# Patient Record
Sex: Male | Born: 1970 | Race: White | Hispanic: No | Marital: Married | Smoking: Never smoker
Health system: Southern US, Community
[De-identification: ages and names within clinical notes are randomized; demographics above are authoritative.]

## PROBLEM LIST (undated history)

## (undated) HISTORY — PX: CHOLECYSTECTOMY: SHX55

## (undated) HISTORY — PX: HERNIA REPAIR: SHX51

---

## 2018-07-31 DIAGNOSIS — G8929 Other chronic pain: Secondary | ICD-10-CM | POA: Insufficient documentation

## 2018-07-31 DIAGNOSIS — R0683 Snoring: Secondary | ICD-10-CM | POA: Insufficient documentation

## 2018-08-28 DIAGNOSIS — B0229 Other postherpetic nervous system involvement: Secondary | ICD-10-CM | POA: Insufficient documentation

## 2018-08-28 DIAGNOSIS — G2581 Restless legs syndrome: Secondary | ICD-10-CM | POA: Diagnosis present

## 2018-08-28 HISTORY — DX: Restless legs syndrome: G25.81

## 2019-11-01 DIAGNOSIS — M3500 Sicca syndrome, unspecified: Secondary | ICD-10-CM

## 2019-11-01 DIAGNOSIS — M1A09X0 Idiopathic chronic gout, multiple sites, without tophus (tophi): Secondary | ICD-10-CM

## 2019-11-01 HISTORY — DX: Sjogren syndrome, unspecified: M35.00

## 2019-11-01 HISTORY — DX: Idiopathic chronic gout, multiple sites, without tophus (tophi): M1A.09X0

## 2019-12-19 DIAGNOSIS — I82409 Acute embolism and thrombosis of unspecified deep veins of unspecified lower extremity: Secondary | ICD-10-CM

## 2019-12-19 HISTORY — DX: Acute embolism and thrombosis of unspecified deep veins of unspecified lower extremity: I82.409

## 2020-01-17 ENCOUNTER — Emergency Department (HOSPITAL_BASED_OUTPATIENT_CLINIC_OR_DEPARTMENT_OTHER): Payer: BC Managed Care – PPO

## 2020-01-17 ENCOUNTER — Other Ambulatory Visit (HOSPITAL_BASED_OUTPATIENT_CLINIC_OR_DEPARTMENT_OTHER): Payer: Self-pay | Admitting: Physician Assistant

## 2020-01-17 ENCOUNTER — Encounter (HOSPITAL_BASED_OUTPATIENT_CLINIC_OR_DEPARTMENT_OTHER): Payer: Self-pay | Admitting: Emergency Medicine

## 2020-01-17 ENCOUNTER — Other Ambulatory Visit: Payer: Self-pay

## 2020-01-17 ENCOUNTER — Emergency Department (HOSPITAL_BASED_OUTPATIENT_CLINIC_OR_DEPARTMENT_OTHER)
Admission: EM | Admit: 2020-01-17 | Discharge: 2020-01-17 | Disposition: A | Discharge status: Home / Self Care | Payer: BC Managed Care – PPO | Attending: Emergency Medicine | Admitting: Emergency Medicine

## 2020-01-17 DIAGNOSIS — I2694 Multiple subsegmental pulmonary emboli without acute cor pulmonale: Secondary | ICD-10-CM | POA: Insufficient documentation

## 2020-01-17 DIAGNOSIS — J1282 Pneumonia due to coronavirus disease 2019: Secondary | ICD-10-CM | POA: Diagnosis not present

## 2020-01-17 DIAGNOSIS — U071 COVID-19: Secondary | ICD-10-CM | POA: Insufficient documentation

## 2020-01-17 DIAGNOSIS — I82441 Acute embolism and thrombosis of right tibial vein: Secondary | ICD-10-CM | POA: Insufficient documentation

## 2020-01-17 DIAGNOSIS — Z7901 Long term (current) use of anticoagulants: Secondary | ICD-10-CM | POA: Insufficient documentation

## 2020-01-17 DIAGNOSIS — I82451 Acute embolism and thrombosis of right peroneal vein: Secondary | ICD-10-CM | POA: Insufficient documentation

## 2020-01-17 DIAGNOSIS — R0602 Shortness of breath: Secondary | ICD-10-CM | POA: Diagnosis present

## 2020-01-17 LAB — BASIC METABOLIC PANEL
Anion gap: 8 (ref 5–15)
BUN: 12 mg/dL (ref 6–20)
CO2: 27 mmol/L (ref 22–32)
Calcium: 8.2 mg/dL — ABNORMAL LOW (ref 8.9–10.3)
Chloride: 99 mmol/L (ref 98–111)
Creatinine, Ser: 0.8 mg/dL (ref 0.61–1.24)
GFR, Estimated: >60 mL/min (ref >60)
Glucose, Bld: 89 mg/dL (ref 70–99)
Potassium: 3.9 mmol/L (ref 3.5–5.1)
Sodium: 134 mmol/L — ABNORMAL LOW (ref 135–145)

## 2020-01-17 LAB — CBC WITH DIFFERENTIAL/PLATELET
Abs Immature Granulocytes: 0.04 10*3/uL (ref 0.00–0.07)
Basophils Absolute: 0 10*3/uL (ref 0.0–0.1)
Basophils Relative: 0 %
Eosinophils Absolute: 0.2 10*3/uL (ref 0.0–0.5)
Eosinophils Relative: 2 %
HCT: 38.9 % — ABNORMAL LOW (ref 39.0–52.0)
Hemoglobin: 13.3 g/dL (ref 13.0–17.0)
Immature Granulocytes: 0 %
Lymphocytes Relative: 14 %
Lymphs Abs: 1.4 10*3/uL (ref 0.7–4.0)
MCH: 31.7 pg (ref 26.0–34.0)
MCHC: 34.2 g/dL (ref 30.0–36.0)
MCV: 92.6 fL (ref 80.0–100.0)
Monocytes Absolute: 0.9 10*3/uL (ref 0.1–1.0)
Monocytes Relative: 9 %
Neutro Abs: 7.3 10*3/uL (ref 1.7–7.7)
Neutrophils Relative %: 75 %
Platelets: 295 10*3/uL (ref 150–400)
RBC: 4.2 MIL/uL — ABNORMAL LOW (ref 4.22–5.81)
RDW: 12.2 % (ref 11.5–15.5)
WBC: 9.7 10*3/uL (ref 4.0–10.5)
nRBC: 0 % (ref 0.0–0.2)

## 2020-01-17 LAB — TROPONIN I (HIGH SENSITIVITY)
Troponin I (High Sensitivity): 5 ng/L (ref <18)
Troponin I (High Sensitivity): 4 ng/L (ref <18)

## 2020-01-17 MED ORDER — RIVAROXABAN 15 MG PO TABS
15.0000 mg | ORAL_TABLET | Freq: Once | ORAL | Status: AC
  Administered 2020-01-17: 19:00:00 15 mg via ORAL
  Filled 2020-01-17: qty 1

## 2020-01-17 MED ORDER — ACETAMINOPHEN 325 MG PO TABS
650.0000 mg | ORAL_TABLET | Freq: Once | ORAL | Status: AC
  Administered 2020-01-17: 18:00:00 650 mg via ORAL
  Filled 2020-01-17: qty 2

## 2020-01-17 MED ORDER — RIVAROXABAN (XARELTO) VTE STARTER PACK (15 & 20 MG): ORAL_TABLET | ORAL | 0 refills | Status: DC

## 2020-01-17 MED ORDER — IOHEXOL 350 MG/ML SOLN
100.0000 mL | Freq: Once | INTRAVENOUS | Status: AC | PRN
  Administered 2020-01-17: 17:00:00 100 mL via INTRAVENOUS

## 2020-01-17 NOTE — ED Notes (Signed)
Pt in CT.

## 2020-01-17 NOTE — ED Provider Notes (Addendum)
[facility] EMERGENCY DEPARTMENT Provider Note   CSN: [id number] Arrival date & time: 01/17/20  1142    History Chief Complaint  Patient presents with  . Leg Pain    right    Ian Gomez is a 49 y.o. male with past medical history significant for recent Covid diagnosis on 01/02/2020, recently diagnosed with pneumonia at urgent care on 01/10/2020.  Who presents for evaluation of right leg pain.  States 3 days ago he developed pain to his right calf.  Has noted some mild swelling.  He was told he needed to look out for this as Covid can cause blood clots.  States that shortness of breath is at his baseline from when he was diagnosed with Covid over the last 10 days. No worsening SOB, CP.  Denies fever, chills, nausea, vomiting, chest pain, hemoptysis, abdominal pain, diarrhea, dysuria.  He denies any prior history of PE or DVT.  No recent surgery, immobilization.  Rates his leg pain a 8/10.  Denies any redness or warmth.  No breaks in skin.  Denies additional aggravating or alleviating factors.  History obtained from patient and past medical records.  No interpreter used  HPI     History reviewed. No pertinent past medical history.  There are no problems to display for this patient.   History reviewed. No pertinent surgical history.     No family history on file.     Home Medications Prior to Admission medications   Medication Sig Start Date End Date Taking? Authorizing Provider  allopurinol (ZYLOPRIM) 300 MG tablet Take 300 mg by mouth daily.   Yes [provider]  amoxicillin-clavulanate (AUGMENTIN) 875-125 MG tablet Take 1 tablet by mouth 2 (two) times daily.   Yes [provider]  DULoxetine (CYMBALTA) 20 MG capsule Take 20 mg by mouth daily.   Yes [provider]  esomeprazole (NEXIUM) 40 MG capsule Take 40 mg by mouth daily at 12 noon.   Yes [provider]  ibuprofen (ADVIL) 200 MG tablet Take 200 mg by mouth every 6 (six)  hours as needed.   Yes [provider]  RIVAROXABAN Carlena Hurl) VTE STARTER PACK (15 & 20 MG) Follow package directions: Take one 15mg  tablet by mouth twice a day. On day 22, switch to one 20mg  tablet once a day. Take with food. 01/17/20  Yes Erling Arrazola A, PA-C    Allergies    Patient has no known allergies.  Review of Systems   Review of Systems  Constitutional: Negative.   HENT: Negative.   Respiratory: Positive for shortness of breath (Since dx of covid, not worsening).   Cardiovascular: Negative.   Gastrointestinal: Negative.   Genitourinary: Negative.   Musculoskeletal:       Right left pain  Skin: Negative.   Neurological: Negative.   All other systems reviewed and are negative.   Physical Exam Updated Vital Signs BP 125/82   Pulse 74   Temp 98.9 F (37.2 C) (Oral)   Resp 13   Ht 5\' 10"  (1.778 m)   Wt 88.9 kg   SpO2 96%   BMI 28.12 kg/m   Physical Exam Vitals and nursing note reviewed.  Constitutional:      General: He is not in acute distress.    Appearance: He is not ill-appearing, toxic-appearing or diaphoretic.  HENT:     Head: Normocephalic and atraumatic.     Jaw: There is normal jaw occlusion.     Right Ear: Tympanic membrane, ear canal  and external ear normal. There is no impacted cerumen. No hemotympanum. Tympanic membrane is not injected, scarred, perforated, erythematous, retracted or bulging.     Left Ear: Tympanic membrane, ear canal and external ear normal. There is no impacted cerumen. No hemotympanum. Tympanic membrane is not injected, scarred, perforated, erythematous, retracted or bulging.     Ears:     Comments: No Mastoid tenderness.    Nose:     Comments: Clear rhinorrhea and congestion to bilateral nares.  No sinus tenderness.    Mouth/Throat:     Comments: Posterior oropharynx clear.  Mucous membranes moist.  Tonsils without erythema or exudate.  Uvula midline without deviation.  No evidence of PTA or RPA.  No drooling,  dysphasia or trismus.  Phonation normal. Neck:     Trachea: Trachea and phonation normal.     Comments: No Neck stiffness or neck rigidity. No cervical lymphadenopathy. Cardiovascular:     Comments: No murmurs rubs or gallops. Pulmonary:     Comments: Clear to auscultation bilaterally without wheeze, rhonchi or rales.  No accessory muscle usage.  Able speak in full sentences. Abdominal:     Comments: Soft, nontender without rebound or guarding.  No CVA tenderness.  Musculoskeletal:     Cervical back: Full passive range of motion without pain and normal range of motion.     Comments: Moves all 4 extremities without difficulty.  Edema to right calf.  Tenderness to popliteal fossa.  No erythema or warmth.  Skin:    Comments: Brisk capillary refill.  No rashes or lesions.  Neurological:     Mental Status: He is alert.     Comments: Ambulatory in department without difficulty.  Cranial nerves II through XII grossly intact.  No facial droop.  No aphasia.    ED Results / Procedures / Treatments   Labs (all labs ordered are listed, but only abnormal results are displayed) Labs Reviewed  CBC WITH DIFFERENTIAL/PLATELET - Abnormal; Notable for the following components:      Result Value   RBC 4.20 (*)    HCT 38.9 (*)    All other components within normal limits  BASIC METABOLIC PANEL - Abnormal; Notable for the following components:   Sodium 134 (*)    Calcium 8.2 (*)    All other components within normal limits  TROPONIN I (HIGH SENSITIVITY)  TROPONIN I (HIGH SENSITIVITY)    EKG EKG Interpretation  Date/Time:  Thursday January 17 2020 15:36:38 EST Ventricular Rate:  63 PR Interval:    QRS Duration: 96 QT Interval:  389 QTC Calculation: 399 R Axis:   51 Text Interpretation: Sinus rhythm Baseline wander in lead(s) V5 No prior ECG for comparison. No STEMI Confirmed by Theda Belfast ([id number]) on 01/17/2020 6:17:16 PM   Radiology CT Angio Chest PE W/Cm &/Or Wo Cm  Result Date:  01/17/2020 CLINICAL DATA:  PE suspected, high prob SOB, COVID, Positive DVT study COVID positive with right leg DVT. EXAM: CT ANGIOGRAPHY CHEST WITH CONTRAST TECHNIQUE: Multidetector CT imaging of the chest was performed using the standard protocol during bolus administration of intravenous contrast. Multiplanar CT image reconstructions and MIPs were obtained to evaluate the vascular anatomy. CONTRAST:  OMNIPAQUE IOHEXOL 350 MG/ML SOLN COMPARISON:  Chest radiograph 01/07/2020 FINDINGS: Cardiovascular: Positive for pulmonary embolus with filling defects in the segmental right lower lobe pulmonary arteries extending into subsegmental branches. Subsegmental filling defects in the right middle lobe and left lower lobe pulmonary arteries. Thromboembolic burden is mild. There is no right  heart strain, RV to LV ratio is less than 1. Heart is normal in size. No pericardial effusion. Normal caliber thoracic aorta without dissection or acute aortic findings. Mediastinum/Nodes: Mild bilateral hilar adenopathy, 14 mm on the right and 11 mm on the left. No enlarged mediastinal nodes. No thyroid nodule. Slightly patulous esophagus without wall thickening. No pneumomediastinum. Lungs/Pleura: Multifocal geographic, nodular consolidative and ground-glass opacities throughout both lungs in a peripheral and basilar predominant distribution. Findings typical of COVID-19, moderate parenchymal involvement. No pleural fluid. No pneumothorax. There is mild central bronchial thickening. Upper Abdomen: Cholecystectomy.  No acute upper abdominal findings. Musculoskeletal: Mild scoliosis with degenerative change in the thoracic spine. There are no acute or suspicious osseous abnormalities. Review of the MIP images confirms the above findings. IMPRESSION: 1. Positive for bilateral pulmonary embolus with mild clot burden. Filling defects within the segmental right lower lobe, subsegmental right lower, middle, and left lower lobe pulmonary  arteries. No right heart strain. 2. Multifocal geographic, nodular consolidative and ground-glass opacities throughout both lungs in a peripheral and basilar predominant distribution. Findings typical of COVID-19, moderate parenchymal involvement. 3. Mild bilateral hilar adenopathy is likely reactive. Critical Value/emergent results were called by telephone at the time of interpretation on 01/17/2020 at 5:44 pm to Dr Rush Landmark, who verbally acknowledged these results. Electronically Signed   By: Narda Rutherford M.D.   On: 01/17/2020 17:45   US Venous Img Lower Unilateral Right  Result Date: 01/17/2020 CLINICAL DATA:  Right calf pain and edema. EXAM: RIGHT LOWER EXTREMITY VENOUS DOPPLER ULTRASOUND TECHNIQUE: Gray-scale sonography with graded compression, as well as color Doppler and duplex ultrasound were performed to evaluate the lower extremity deep venous systems from the level of the common femoral vein and including the common femoral, femoral, profunda femoral, popliteal and calf veins including the posterior tibial, peroneal and gastrocnemius veins when visible. The superficial great saphenous vein was also interrogated. Spectral Doppler was utilized to evaluate flow at rest and with distal augmentation maneuvers in the common femoral, femoral and popliteal veins. COMPARISON:  None. FINDINGS: Contralateral Common Femoral Vein: Respiratory phasicity is normal and symmetric with the symptomatic side. No evidence of thrombus. Normal compressibility. Common Femoral Vein: No evidence of thrombus. Normal compressibility, respiratory phasicity and response to augmentation. Saphenofemoral Junction: No evidence of thrombus. Normal compressibility and flow on color Doppler imaging. Profunda Femoral Vein: No evidence of thrombus. Normal compressibility and flow on color Doppler imaging. Femoral Vein: No evidence of thrombus. Normal compressibility, respiratory phasicity and response to augmentation. Popliteal Vein:  No evidence of thrombus. Normal compressibility, respiratory phasicity and response to augmentation. Calf Veins: Thrombus identified in visualized segments of the right posterior tibial and peroneal veins. Superficial Great Saphenous Vein: There is some thrombus identified in segments of the great saphenous vein at the level of the knee and extending just into the distal thigh. Venous Reflux:  None. Other Findings:  No abnormal fluid collections identified. IMPRESSION: 1. Positive for deep vein thrombosis in the right posterior tibial and peroneal veins below the knee. 2. Superficial thrombophlebitis in a segment of the great saphenous vein at the level of the knee and distal thigh. Electronically Signed   By: Irish Lack M.D.   On: 01/17/2020 15:10    Procedures .Critical Care Performed by: Linwood Dibbles, PA-C Authorized by: Linwood Dibbles, PA-C   Critical care provider statement:    Critical care time (minutes):  35   Critical care was necessary to treat or prevent imminent or life-threatening deterioration  of the following conditions:  Respiratory failure and circulatory failure   Critical care was time spent personally by me on the following activities:  Discussions with consultants, evaluation of patient's response to treatment, examination of patient, ordering and performing treatments and interventions, ordering and review of laboratory studies, ordering and review of radiographic studies, pulse oximetry, re-evaluation of patient's condition, obtaining history from patient or surrogate and review of old charts   (including critical care time)  Medications Ordered in ED Medications  iohexol (OMNIPAQUE) 350 MG/ML injection 100 mL (100 mLs Intravenous Contrast Given 01/17/20 1710)  acetaminophen (TYLENOL) tablet 650 mg (650 mg Oral Given 01/17/20 1811)  Rivaroxaban (XARELTO) tablet 15 mg (15 mg Oral Given 01/17/20 1925)   ED Course  I have reviewed the triage vital signs and  the nursing notes.  Pertinent labs & imaging results that were available during my care of the patient were reviewed by me and considered in my medical decision making (see chart for details).  49 year old, recent Covid diagnosis presents for evaluation of right calf pain.  No prior history of PE or DVT.  No history of clotting disorders.  Diagnosed 2 weeks ago with COVID.  States that shortness of breath is at his baseline from when he was diagnosed with Covid.  His shortness of breath is not worsening.  He has no hemoptysis or chest pain.  On arrival he is without tachycardia, tachypnea or hypoxia.  He appears overall well.  He does not appear clinically dehydrated.  Heart and lungs clear.  Abdomen soft, nontender.  He has a nonfocal neuro exam without deficits.  Does have some swelling to his right calf.  No overlying erythema, warmth, no breaks in skin.  Plan on labs, imaging and reassess  Labs and imaging personally reviewed and interpreted:  CBC without leukocytosis Metabolic panel 134, no additional electrolyte, renal or liver abnormality Trop 5>>4 EKG without STEMI, no right heart strain US with Tibial and peroneal DVT CTA chest with bilateral PE with MILD clot burden, No heart strain per Radiologist.  Shared decision making with patient for outpatient vs inpatient management given very reassuring VS and no heart strain on CT.  He remains without tachycardia, tachypnea or hypoxia.  He continues to deny any chest pain or shortness of breath.  Will ambulate patient to see if he has any respiratory distress.  PESI score 60 pts Very low risk per MD calc.  Patient ambulated in halls he denies any dyspnea on exertion. He was without any tachycardia, tachypnea or hypoxia with ambulation.  Again, Shared decision making with patient. He is comfortable starting oral anticoagulation here in ED with close outpatient followup. I have had very thorough discussion with patient new or worsening symptoms  where he is agreeable for this. Given first dose of Xarelto. No hx of GI bleed, spontaneous head bleed.  He will follow-up with PCP over the next week.  The patient has been appropriately medically screened and/or stabilized in the ED. I have low suspicion for any other emergent medical condition which would require further screening, evaluation or treatment in the ED or require inpatient management.  Patient is hemodynamically stable and in no acute distress.  Patient able to ambulate in department prior to ED.  Evaluation does not show acute pathology that would require ongoing or additional emergent interventions while in the emergency department or further inpatient treatment.  I have discussed the diagnosis with the patient and answered all questions.  Pain is been managed while  in the emergency department and patient has no further complaints prior to discharge.  Patient is comfortable with plan discussed in room and is stable for discharge at this time.  I have discussed strict return precautions for returning to the emergency department.  Patient was encouraged to follow-up with PCP/specialist refer to at discharge.  Patient seen and evaluated by attending, Dr. Rush Landmark who agrees with above treatment, plan and disposition.    MDM Rules/Calculators/A&P                          Ian Gomez was evaluated in Emergency Department on 01/17/2020 for the symptoms described in the history of present illness. He was evaluated in the context of the global COVID-19 pandemic, which necessitated consideration that the patient might be at risk for infection with the SARS-CoV-2 virus that causes COVID-19. Institutional protocols and algorithms that pertain to the evaluation of patients at risk for COVID-19 are in a state of rapid change based on information released by regulatory bodies including the CDC and federal and state organizations. These policies and algorithms were followed during the patient's care  in the ED. Final Clinical Impression(s) / ED Diagnoses Final diagnoses:  Multiple subsegmental pulmonary emboli without acute cor pulmonale (HCC)  Acute deep vein thrombosis (DVT) of right peroneal vein (HCC)  Pneumonia due to COVID-19 virus    Rx / DC Orders ED Discharge Orders         Ordered    RIVAROXABAN (XARELTO) VTE STARTER PACK (15 & 20 MG)        01/17/20 1915           Chaunce Winkels A, PA-C 01/17/20 1943    Linwood Dibbles, PA-C 01/17/20 1947    Tegeler, Canary Brim, MD 01/17/20 [phone]

## 2020-01-17 NOTE — Discharge Instructions (Signed)
We have given you your first dose of blood thinner here in the ED.  It is important to take as prescribed. Do not miss any doses.  If you have worsening Chest pain, shortness of breath, swelling of you legs, Headache, lightheadedness, passing out seek reevaluation here in the ED

## 2020-01-17 NOTE — ED Notes (Signed)
Discharge instructions discussed with patient. Verbalized understanding. Importance of following medication instructions and not missing any doses discussed. Departs ED at this time in stable conditions.

## 2020-01-17 NOTE — ED Triage Notes (Signed)
Reports right leg, covid + 01/02/2020. Pneumonia 01/10/2020. Worried about DVT. Denies chest pain.

## 2020-01-17 NOTE — ED Notes (Signed)
Pt on monitor and vitals cycling 

## 2020-01-18 MED FILL — XARELTO STARTER PACK: 15 & 20 | 30 days supply | Qty: 51 | Fill #0

## 2020-02-01 ENCOUNTER — Other Ambulatory Visit (HOSPITAL_BASED_OUTPATIENT_CLINIC_OR_DEPARTMENT_OTHER): Payer: Self-pay | Admitting: Internal Medicine

## 2020-02-13 MED FILL — XARELTO 20 MG TABLET: 20 | 30 days supply | Qty: 30 | Fill #0

## 2020-03-05 DIAGNOSIS — Z86718 Personal history of other venous thrombosis and embolism: Secondary | ICD-10-CM

## 2020-03-05 DIAGNOSIS — Z1589 Genetic susceptibility to other disease: Secondary | ICD-10-CM

## 2020-03-05 DIAGNOSIS — Z86711 Personal history of pulmonary embolism: Secondary | ICD-10-CM | POA: Diagnosis present

## 2020-03-05 HISTORY — DX: Genetic susceptibility to other disease: Z15.89

## 2020-03-05 HISTORY — DX: Personal history of pulmonary embolism: Z86.711

## 2020-03-10 MED FILL — XARELTO 20 MG TABLET: 20 | 30 days supply | Qty: 30 | Fill #1

## 2020-04-14 MED FILL — XARELTO 20 MG TABLET: 20 | 30 days supply | Qty: 30 | Fill #2

## 2020-05-12 ENCOUNTER — Other Ambulatory Visit (HOSPITAL_BASED_OUTPATIENT_CLINIC_OR_DEPARTMENT_OTHER): Payer: Self-pay

## 2020-05-12 MED FILL — Rivaroxaban Tab 20 MG: ORAL | 90 days supply | Qty: 90 | Fill #0 | Status: AC

## 2020-09-03 ENCOUNTER — Emergency Department (HOSPITAL_BASED_OUTPATIENT_CLINIC_OR_DEPARTMENT_OTHER): Payer: Managed Care, Other (non HMO)

## 2020-09-03 ENCOUNTER — Inpatient Hospital Stay (HOSPITAL_BASED_OUTPATIENT_CLINIC_OR_DEPARTMENT_OTHER)
Admission: EM | Admit: 2020-09-03 | Discharge: 2020-09-08 | DRG: 390 | Disposition: A | Discharge status: Home / Self Care | Payer: Managed Care, Other (non HMO) | Attending: Surgery | Admitting: Surgery

## 2020-09-03 ENCOUNTER — Encounter (HOSPITAL_BASED_OUTPATIENT_CLINIC_OR_DEPARTMENT_OTHER): Payer: Self-pay

## 2020-09-03 ENCOUNTER — Other Ambulatory Visit: Payer: Self-pay

## 2020-09-03 DIAGNOSIS — G43909 Migraine, unspecified, not intractable, without status migrainosus: Secondary | ICD-10-CM | POA: Diagnosis present

## 2020-09-03 DIAGNOSIS — Z7901 Long term (current) use of anticoagulants: Secondary | ICD-10-CM | POA: Diagnosis not present

## 2020-09-03 DIAGNOSIS — G2581 Restless legs syndrome: Secondary | ICD-10-CM | POA: Diagnosis present

## 2020-09-03 DIAGNOSIS — K56600 Partial intestinal obstruction, unspecified as to cause: Secondary | ICD-10-CM | POA: Diagnosis present

## 2020-09-03 DIAGNOSIS — K529 Noninfective gastroenteritis and colitis, unspecified: Secondary | ICD-10-CM | POA: Diagnosis present

## 2020-09-03 DIAGNOSIS — M1A09X0 Idiopathic chronic gout, multiple sites, without tophus (tophi): Secondary | ICD-10-CM | POA: Diagnosis present

## 2020-09-03 DIAGNOSIS — Z86718 Personal history of other venous thrombosis and embolism: Secondary | ICD-10-CM

## 2020-09-03 DIAGNOSIS — J342 Deviated nasal septum: Secondary | ICD-10-CM | POA: Diagnosis present

## 2020-09-03 DIAGNOSIS — Z8616 Personal history of COVID-19: Secondary | ICD-10-CM | POA: Diagnosis not present

## 2020-09-03 DIAGNOSIS — M1A9XX0 Chronic gout, unspecified, without tophus (tophi): Secondary | ICD-10-CM | POA: Diagnosis present

## 2020-09-03 DIAGNOSIS — Z86711 Personal history of pulmonary embolism: Secondary | ICD-10-CM | POA: Diagnosis not present

## 2020-09-03 DIAGNOSIS — K567 Ileus, unspecified: Secondary | ICD-10-CM | POA: Diagnosis present

## 2020-09-03 DIAGNOSIS — Z1589 Genetic susceptibility to other disease: Secondary | ICD-10-CM

## 2020-09-03 DIAGNOSIS — K56609 Unspecified intestinal obstruction, unspecified as to partial versus complete obstruction: Secondary | ICD-10-CM | POA: Diagnosis not present

## 2020-09-03 DIAGNOSIS — Z9049 Acquired absence of other specified parts of digestive tract: Secondary | ICD-10-CM | POA: Diagnosis not present

## 2020-09-03 DIAGNOSIS — Z20822 Contact with and (suspected) exposure to covid-19: Secondary | ICD-10-CM | POA: Diagnosis present

## 2020-09-03 DIAGNOSIS — Z79899 Other long term (current) drug therapy: Secondary | ICD-10-CM | POA: Diagnosis not present

## 2020-09-03 DIAGNOSIS — K219 Gastro-esophageal reflux disease without esophagitis: Secondary | ICD-10-CM | POA: Diagnosis present

## 2020-09-03 DIAGNOSIS — Z0189 Encounter for other specified special examinations: Secondary | ICD-10-CM

## 2020-09-03 HISTORY — DX: Personal history of COVID-19: Z86.16

## 2020-09-03 LAB — CBC WITH DIFFERENTIAL/PLATELET
Abs Immature Granulocytes: 0.03 10*3/uL (ref 0.00–0.07)
Basophils Absolute: 0 10*3/uL (ref 0.0–0.1)
Basophils Relative: 0 %
Eosinophils Absolute: 0.1 10*3/uL (ref 0.0–0.5)
Eosinophils Relative: 1 %
HCT: 45.8 % (ref 39.0–52.0)
Hemoglobin: 16.6 g/dL (ref 13.0–17.0)
Immature Granulocytes: 0 %
Lymphocytes Relative: 11 %
Lymphs Abs: 1 10*3/uL (ref 0.7–4.0)
MCH: 32.9 pg (ref 26.0–34.0)
MCHC: 36.2 g/dL — ABNORMAL HIGH (ref 30.0–36.0)
MCV: 90.9 fL (ref 80.0–100.0)
Monocytes Absolute: 1.2 10*3/uL — ABNORMAL HIGH (ref 0.1–1.0)
Monocytes Relative: 14 %
Neutro Abs: 6.5 10*3/uL (ref 1.7–7.7)
Neutrophils Relative %: 74 %
Platelets: 208 10*3/uL (ref 150–400)
RBC: 5.04 MIL/uL (ref 4.22–5.81)
RDW: 12.3 % (ref 11.5–15.5)
WBC: 8.7 10*3/uL (ref 4.0–10.5)
nRBC: 0 % (ref 0.0–0.2)

## 2020-09-03 LAB — COMPREHENSIVE METABOLIC PANEL
ALT: 32 U/L (ref 0–44)
AST: 34 U/L (ref 15–41)
Albumin: 4.1 g/dL (ref 3.5–5.0)
Alkaline Phosphatase: 69 U/L (ref 38–126)
Anion gap: 10 (ref 5–15)
BUN: 10 mg/dL (ref 6–20)
CO2: 25 mmol/L (ref 22–32)
Calcium: 8.9 mg/dL (ref 8.9–10.3)
Chloride: 100 mmol/L (ref 98–111)
Creatinine, Ser: 0.82 mg/dL (ref 0.61–1.24)
GFR, Estimated: >60 mL/min (ref >60)
Glucose, Bld: 105 mg/dL — ABNORMAL HIGH (ref 70–99)
Potassium: 3.8 mmol/L (ref 3.5–5.1)
Sodium: 135 mmol/L (ref 135–145)
Total Bilirubin: 0.6 mg/dL (ref 0.3–1.2)
Total Protein: 8 g/dL (ref 6.5–8.1)

## 2020-09-03 LAB — RESP PANEL BY RT-PCR (FLU A&B, COVID) ARPGX2
Influenza A by PCR: NEGATIVE
Influenza B by PCR: NEGATIVE
SARS Coronavirus 2 by RT PCR: NEGATIVE

## 2020-09-03 LAB — LIPASE, BLOOD: Lipase: 24 U/L (ref 11–51)

## 2020-09-03 MED ORDER — LACTATED RINGERS IV BOLUS: 1000.0000 mL | Freq: Three times a day (TID) | INTRAVENOUS | Status: AC | PRN

## 2020-09-03 MED ORDER — PHENOL 1.4 % MT LIQD: 2.0000 | OROMUCOSAL | Status: DC | PRN

## 2020-09-03 MED ORDER — LACTATED RINGERS IV BOLUS
1000.0000 mL | Freq: Once | INTRAVENOUS | Status: AC
  Administered 2020-09-03: 1000 mL via INTRAVENOUS

## 2020-09-03 MED ORDER — METOPROLOL TARTRATE 5 MG/5ML IV SOLN: 5.0000 mg | Freq: Four times a day (QID) | INTRAVENOUS | Status: DC | PRN

## 2020-09-03 MED ORDER — METHOCARBAMOL 1000 MG/10ML IJ SOLN
1000.0000 mg | Freq: Four times a day (QID) | INTRAVENOUS | Status: DC | PRN
  Administered 2020-09-04: 1000 mg via INTRAVENOUS
  Filled 2020-09-03: qty 1000
  Filled 2020-09-03: qty 10

## 2020-09-03 MED ORDER — PROCHLORPERAZINE EDISYLATE 10 MG/2ML IJ SOLN: 5.0000 mg | INTRAMUSCULAR | Status: DC | PRN

## 2020-09-03 MED ORDER — SIMETHICONE 40 MG/0.6ML PO SUSP
80.0000 mg | Freq: Four times a day (QID) | ORAL | Status: DC | PRN
  Filled 2020-09-03: qty 1.2

## 2020-09-03 MED ORDER — HYDROMORPHONE HCL 1 MG/ML IJ SOLN
1.0000 mg | Freq: Once | INTRAMUSCULAR | Status: AC
  Administered 2020-09-03: 1 mg via INTRAVENOUS
  Filled 2020-09-03: qty 1

## 2020-09-03 MED ORDER — METOCLOPRAMIDE HCL 5 MG/ML IJ SOLN
10.0000 mg | Freq: Once | INTRAMUSCULAR | Status: AC
  Administered 2020-09-03: 10 mg via INTRAVENOUS
  Filled 2020-09-03: qty 2

## 2020-09-03 MED ORDER — ACETAMINOPHEN 650 MG RE SUPP: 650.0000 mg | Freq: Four times a day (QID) | RECTAL | Status: DC | PRN

## 2020-09-03 MED ORDER — HYDROMORPHONE HCL 1 MG/ML IJ SOLN
0.5000 mg | INTRAMUSCULAR | Status: DC | PRN
  Administered 2020-09-03: 0.5 mg via INTRAVENOUS
  Administered 2020-09-04 (×2): 1 mg via INTRAVENOUS
  Filled 2020-09-03 (×3): qty 1

## 2020-09-03 MED ORDER — HYDROMORPHONE HCL 1 MG/ML IJ SOLN
1.0000 mg | Freq: Once | INTRAMUSCULAR | Status: AC | PRN
  Administered 2020-09-03: 1 mg via INTRAVENOUS
  Filled 2020-09-03: qty 1

## 2020-09-03 MED ORDER — DIATRIZOATE MEGLUMINE & SODIUM 66-10 % PO SOLN: 90.0000 mL | Freq: Once | ORAL | Status: DC

## 2020-09-03 MED ORDER — LIP MEDEX EX OINT
1.0000 "application " | TOPICAL_OINTMENT | Freq: Two times a day (BID) | CUTANEOUS | Status: DC
  Administered 2020-09-03 – 2020-09-07 (×8): 1 via TOPICAL
  Filled 2020-09-03 (×3): qty 7

## 2020-09-03 MED ORDER — SODIUM CHLORIDE 0.9 % IV SOLN
8.0000 mg | Freq: Four times a day (QID) | INTRAVENOUS | Status: DC | PRN
  Filled 2020-09-03: qty 4

## 2020-09-03 MED ORDER — ONDANSETRON HCL 4 MG/2ML IJ SOLN
4.0000 mg | Freq: Four times a day (QID) | INTRAMUSCULAR | Status: DC | PRN
  Administered 2020-09-04 – 2020-09-05 (×4): 4 mg via INTRAVENOUS
  Filled 2020-09-03 (×4): qty 2

## 2020-09-03 MED ORDER — LACTATED RINGERS IV SOLN: INTRAVENOUS | Status: DC

## 2020-09-03 MED ORDER — MAGIC MOUTHWASH
15.0000 mL | Freq: Four times a day (QID) | ORAL | Status: DC | PRN
  Filled 2020-09-03: qty 15

## 2020-09-03 MED ORDER — IOHEXOL 300 MG/ML  SOLN
100.0000 mL | Freq: Once | INTRAMUSCULAR | Status: AC | PRN
  Administered 2020-09-03: 79 mL via INTRAVENOUS

## 2020-09-03 MED ORDER — ALUM & MAG HYDROXIDE-SIMETH 200-200-20 MG/5ML PO SUSP
30.0000 mL | Freq: Four times a day (QID) | ORAL | Status: DC | PRN
  Administered 2020-09-08: 30 mL via ORAL
  Filled 2020-09-03: qty 30

## 2020-09-03 MED ORDER — DIPHENHYDRAMINE HCL 50 MG/ML IJ SOLN: 12.5000 mg | Freq: Four times a day (QID) | INTRAMUSCULAR | Status: DC | PRN

## 2020-09-03 MED ORDER — MENTHOL 3 MG MT LOZG: 1.0000 | LOZENGE | OROMUCOSAL | Status: DC | PRN

## 2020-09-03 NOTE — ED Notes (Signed)
Pt has a severe deviated septum. Unsuccessful attempt to insert NG tube x 3.

## 2020-09-03 NOTE — ED Notes (Signed)
ED Provider at bedside. 

## 2020-09-03 NOTE — ED Notes (Signed)
RN made aware that there were 2 attempts to place NG tube earlier, one in each nostril. Placement was complicated by the patient's deviated septum. RN discussed with EDP. Secretary paged surgery team. RN will wait to hear back from surgery team what the plan is for placement following the previous failed attempts.

## 2020-09-03 NOTE — ED Triage Notes (Signed)
Pt arrives ambulatory to triage with c/o abdominal pain starting yesterday with 13 episodes of watery diarrhea today pt reports that he has been vomiting brown and unable to eat with a "tight abdomen".

## 2020-09-03 NOTE — ED Provider Notes (Signed)
[facility] Provider Note   CSN: [id number] Arrival date & time: 09/03/20  1304     History Chief Complaint  Patient presents with   Abdominal Pain    Ian Gomez is a 50 y.o. male.  HPI 50 year old male with severe upper abdominal pain.  Feels like something is blocked.  He started having watery diarrhea yesterday afternoon after developing abdominal pain 4 hours prior.  He tried to drink some water today and while it did not come back up he feels like it was blocked and he was getting a lot more severe pain after drinking it.  He has not had a fever though has had some chills and a low-grade temp of 99.6.  Some back discomfort.  He vomited once previous to this and it was brown.  Ibuprofen and Tylenol helped the fever sensation and chills with body aches but the abdominal pain is still severe.  Past Medical History:  Diagnosis Date   Chronic gout of multiple sites 11/01/2019   DVT (deep venous thrombosis) ([place]) 12/2019   History of COVID-19 [id number] 09/03/2020   History of pulmonary embolism 03/05/2020   HLA B27 (HLA B27 positive) 03/05/2020   RLS (restless legs syndrome) 08/28/2018   Sicca ([place]) 11/01/2019    Patient Active Problem List   Diagnosis Date Noted   History of COVID-19 Dec2021 09/03/2020   SBO (small bowel obstruction) ([place]) 09/03/2020   History of pulmonary embolism 03/05/2020   History of DVT (deep vein thrombosis) 03/05/2020   HLA B27 (HLA B27 positive) 03/05/2020   Sicca ([place]) 11/01/2019   Chronic gout of multiple sites 11/01/2019   RLS (restless legs syndrome) 08/28/2018   Herpes zoster with nervous system complication 75/64/3329   Snoring 07/31/2018   Chronic nonintractable headache 07/31/2018    Past Surgical History:  Procedure Laterality Date   CHOLECYSTECTOMY     HERNIA REPAIR         No family history on file.  Social History   Tobacco Use   Smoking status: Never   Smokeless tobacco: Never  Substance Use  Topics   Alcohol use: Yes    Comment: social   Drug use: Never    Home Medications Prior to Admission medications   Medication Sig Start Date End Date Taking? Authorizing Provider  allopurinol (ZYLOPRIM) 300 MG tablet Take 300 mg by mouth daily.    [provider]  amoxicillin-clavulanate (AUGMENTIN) 875-125 MG tablet Take 1 tablet by mouth 2 (two) times daily.    [provider]  DULoxetine (CYMBALTA) 20 MG capsule Take 20 mg by mouth daily.    [provider]  esomeprazole (NEXIUM) 40 MG capsule Take 40 mg by mouth daily at 12 noon.    [provider]  ibuprofen (ADVIL) 200 MG tablet Take 200 mg by mouth every 6 (six) hours as needed.    [provider]  rivaroxaban (XARELTO) 20 MG TABS tablet TAKE ONE TABLET BY MOUTH DAILY WITH EVENING MEAL 02/01/20 01/31/21  Charleston Poot, MD  RIVAROXABAN [facility] [employer]) VTE STARTER PACK (15 & 20 MG) TAKE ONE 15MG TABLET BY MOUTH 2 TIMES DAILY, ON DAY 22 SWITCH TO ONE 20MG TABLET ONCE DAILY. **TAKE WITH FOOD** 01/17/20 01/16/21  Henderly, Britni A, PA-C    Allergies    Patient has no known allergies.  Review of Systems   Review of Systems  Constitutional:  Positive for chills and fever.  Gastrointestinal:  Positive for abdominal pain, diarrhea and vomiting.  Musculoskeletal:  Positive for myalgias.  All other systems reviewed and are negative.  Physical Exam Updated Vital Signs BP (!) 121/57   Pulse 80   Temp 99.6 F (37.6 C) (Oral)   Resp 17   Ht _0  (1.778 m)   Wt 96.2 kg   SpO2 91%   BMI 30.42 kg/m   Physical Exam Vitals and nursing note reviewed.  Constitutional:      Appearance: He is well-developed. He is not ill-appearing.  HENT:     Head: Normocephalic and atraumatic.     Right Ear: External ear normal.     Left Ear: External ear normal.     Nose: Nose normal.  Eyes:     General:        Right eye: No discharge.        Left eye: No discharge.  Cardiovascular:     Rate and  Rhythm: Regular rhythm. Tachycardia present.     Heart sounds: Normal heart sounds.  Pulmonary:     Effort: Pulmonary effort is normal.     Breath sounds: Normal breath sounds.  Abdominal:     Palpations: Abdomen is soft.     Tenderness: There is abdominal tenderness in the right upper quadrant and epigastric area.     Hernia: A hernia is present. Hernia is present in the ventral area (not incarcerated).  Musculoskeletal:     Cervical back: Neck supple.  Skin:    General: Skin is warm and dry.  Neurological:     Mental Status: He is alert.  Psychiatric:        Mood and Affect: Mood is not anxious.    ED Results / Procedures / Treatments   Labs (all labs ordered are listed, but only abnormal results are displayed) Labs Reviewed  COMPREHENSIVE METABOLIC PANEL - Abnormal; Notable for the following components:      Result Value   Glucose, Bld 105 (*)    All other components within normal limits  CBC WITH DIFFERENTIAL/PLATELET - Abnormal; Notable for the following components:   MCHC 36.2 (*)    Monocytes Absolute 1.2 (*)    All other components within normal limits  RESP PANEL BY RT-PCR (FLU A&B, COVID) [id number]  LIPASE, BLOOD    EKG EKG Interpretation  Date/Time:  Wednesday September 03 2020 13:16:45 EDT Ventricular Rate:  127 PR Interval:  138 QRS Duration: 90 QT Interval:  310 QTC Calculation: 450 R Axis:   65 Text Interpretation: Sinus tachycardia T wave abnormality, consider inferior ischemia Abnormal ECG Confirmed by Sherwood Gambler [phone]) on 09/03/2020 1:48:54 PM  Radiology CT ABDOMEN PELVIS W CONTRAST  Result Date: 09/03/2020 CLINICAL DATA:  Nausea and vomiting. Left upper quadrant abdominal pain. EXAM: CT ABDOMEN AND PELVIS WITH CONTRAST TECHNIQUE: Multidetector CT imaging of the abdomen and pelvis was performed using the standard protocol following bolus administration of intravenous contrast. CONTRAST:  54m OMNIPAQUE IOHEXOL 300 MG/ML  SOLN COMPARISON:  CT abdomen  pelvis 01/31/2012 FINDINGS: Lower chest: Bilateral lower lobe subsegmental atelectasis. Hepatobiliary: No focal liver abnormality. Status post cholecystectomy. No biliary dilatation. Pancreas: Diffusely atrophic. No focal lesion. Otherwise normal pancreatic contour. No surrounding inflammatory changes. No main pancreatic ductal dilatation. Spleen: Normal in size without focal abnormality. Adrenals/Urinary Tract: No adrenal nodule bilaterally. Bilateral kidneys enhance symmetrically. No hydronephrosis. No hydroureter. The urinary bladder is unremarkable. Stomach/Bowel: Stomach is within normal limits. Several loops of mid abdomen small bowel demonstrate dilatation with gas and air-fluid levels. Small bowel caliber measures up to 3.8 cm  on axial images (2:48). No definite transition point. No pneumatosis. No surrounding fat stranding. No evidence of large bowel wall thickening or dilatation. Scattered colonic diverticulosis. Appendix appears normal in caliber with no associated inflammatory changes. Appendicoliths are noted within its lumen. Vascular/Lymphatic: No abdominal aorta or iliac aneurysm. No abdominal, pelvic, or inguinal lymphadenopathy. Reproductive: Prostate is unremarkable. Other: No intraperitoneal free fluid. No intraperitoneal free gas. No organized fluid collection. Musculoskeletal: No abdominal wall hernia or abnormality. No suspicious lytic or blastic osseous lesions. No acute displaced fracture. Multilevel degenerative changes of the spine. IMPRESSION: 1. Findings suggestive of an early/partial bowel obstruction versus ileus. No transition point suggest complete bowel obstruction. No findings to suggest bowel ischemia or perforation. 2. Scattered colonic diverticulosis with no acute diverticulitis. Electronically Signed   By: Iven Finn M.D.   On: 09/03/2020 15:13    Procedures Procedures   Medications Ordered in ED Medications  diatrizoate meglumine-sodium (GASTROGRAFIN) 66-10 %  solution 90 mL (has no administration in time range)  HYDROmorphone (DILAUDID) injection 1 mg (has no administration in time range)  lactated ringers bolus 1,000 mL (0 mLs Intravenous Stopped 09/03/20 1550)  lactated ringers bolus 1,000 mL (1,000 mLs Intravenous New Bag/Given 09/03/20 1415)  HYDROmorphone (DILAUDID) injection 1 mg (1 mg Intravenous Given 09/03/20 1417)  metoCLOPramide (REGLAN) injection 10 mg (10 mg Intravenous Given 09/03/20 1416)  iohexol (OMNIPAQUE) 300 MG/ML solution 100 mL (79 mLs Intravenous Contrast Given 09/03/20 1429)    ED Course  I have reviewed the triage vital signs and the nursing notes.  Pertinent labs & imaging results that were available during my care of the patient were reviewed by me and considered in my medical decision making (see chart for details).    MDM Rules/Calculators/A&P                           CT shows early SBO. Discussed with Dr. Johney Maine, who advises NG tube, and transfer to [facility] ED for surgery to see. Dr. Joya Gaskins accepts in transfer.  Final Clinical Impression(s) / ED Diagnoses Final diagnoses:  Encounter for imaging study to confirm nasogastric (NG) tube placement  Small bowel obstruction [facility])    Rx / DC Orders ED Discharge Orders     None        Sherwood Gambler, MD 09/03/20 1550

## 2020-09-03 NOTE — ED Provider Notes (Signed)
  Physical Exam  BP 117/80   Pulse 85   Temp 98.9 F (37.2 C) (Oral)   Resp 17   Ht 5\' 10"  (1.778 m)   Wt 96.2 kg   SpO2 95%   BMI 30.42 kg/m   Physical Exam  ED Course/Procedures   Clinical Course as of 09/03/20 2040  Wed Sep 03, 2020  1750 I spoke with Dr. Sep 05, 2020.  [AW]    Clinical Course User Index [AW] Rich Reining, MD    Procedures  MDM  Koleen Distance was transferred from [facility] for small bowel obstruction.  Currently states that he is doing well.  Pain well controlled.  Mild headache.  General surgery has been consulted again.       [facility], MD 09/03/20 2040

## 2020-09-03 NOTE — ED Notes (Signed)
Patient transported to CT 

## 2020-09-03 NOTE — H&P (Signed)
Admitting Physician: Nickola Major Raelee Rossmann  Service: General Surgery  CC: Abdominal pain  Subjective   HPI: Ian Gomez is an 50 y.o. male who is here for abdominal pain.  A CT at [facility] demonstrated a partial small bowel obstruction so he was transferred to [facility] for surgery evaluation.  His symptoms started yesterday and he has had pretty miserable last 24 hours.  He has had severe diarrhea throughout the day and 1 episode of brown feculent emesis.  The pain medication and nausea medication have improved the symptoms in the hospital.  He has pain focused to the right of the midline in the mid abdomen, though the pain is somewhat diffuse.  He tried to take his medications with little bread this morning but he threw them up.  Past Medical History:  Diagnosis Date   Chronic gout of multiple sites 11/01/2019   DVT (deep venous thrombosis) ([place]) 12/2019   History of COVID-19 [id number] 09/03/2020   History of pulmonary embolism 03/05/2020   HLA B27 (HLA B27 positive) 03/05/2020   RLS (restless legs syndrome) 08/28/2018   Sicca ([place]) 11/01/2019    Past Surgical History:  Procedure Laterality Date   CHOLECYSTECTOMY     HERNIA REPAIR    His hernia repair was an incisional hernia repair of the cholecystectomy incisions in the midline supraumbilical and epigastric area.  No family history on file.  Social:  reports that he has never smoked. He has never used smokeless tobacco. He reports current alcohol use. He reports that he does not use drugs.  Allergies: No Known Allergies  Medications: Current Outpatient Medications  Medication Instructions   allopurinol (ZYLOPRIM) 300 mg, Oral, Daily   amoxicillin-clavulanate (AUGMENTIN) 875-125 MG tablet 1 tablet, Oral, 2 times daily   DULoxetine (CYMBALTA) 20 mg, Oral, Daily   esomeprazole (NEXIUM) 40 mg, Oral, Daily   ibuprofen (ADVIL) 200 mg, Oral, Every 6 hours PRN   rivaroxaban (XARELTO) 20 MG TABS tablet TAKE ONE  TABLET BY MOUTH DAILY WITH EVENING MEAL   RIVAROXABAN (XARELTO) VTE STARTER PACK (15 & 20 MG) TAKE ONE 15MG TABLET BY MOUTH 2 TIMES DAILY, ON DAY 22 SWITCH TO ONE 20MG TABLET ONCE DAILY. **TAKE WITH FOOD**    ROS - all of the below systems have been reviewed with the patient and positives are indicated with bold text General: chills, fever or night sweats Eyes: blurry vision or double vision ENT: epistaxis or sore throat Allergy/Immunology: itchy/watery eyes or nasal congestion Hematologic/Lymphatic: bleeding problems, blood clots or swollen lymph nodes Endocrine: temperature intolerance or unexpected weight changes Breast: new or changing breast lumps or nipple discharge Resp: cough, shortness of breath, or wheezing CV: chest pain or dyspnea on exertion GI: as per HPI GU: dysuria, trouble voiding, or hematuria MSK: joint pain or joint stiffness Neuro: TIA or stroke symptoms Derm: pruritus and skin lesion changes Psych: anxiety and depression  Objective   PE Blood pressure 136/79, pulse 91, temperature 98.9 F (37.2 C), temperature source Oral, resp. rate 17, height _0  (1.778 m), weight 96.2 kg, SpO2 91 %. Constitutional: NAD; conversant; no deformities Eyes: Moist conjunctiva; no lid lag; anicteric; PERRL Neck: Trachea midline; no thyromegaly Lungs: Normal respiratory effort; no tactile fremitus CV: RRR; no palpable thrills; no pitting edema GI: Abd mild distention, tenderness to palpation in the right mid abdomen, no rebound guarding or peritoneal signs; no palpable hepatosplenomegaly MSK: Normal range of motion of extremities; no clubbing/cyanosis Psychiatric: Appropriate affect; alert and oriented x3 Lymphatic: No  palpable cervical or axillary lymphadenopathy  Results for orders placed or performed during the hospital encounter of 09/03/20 (from the past 24 hour(s))  Comprehensive metabolic panel     Status: Abnormal   Collection Time: 09/03/20  2:09 PM  Result Value  Ref Range   Sodium 135 135 - 145 mmol/L   Potassium 3.8 3.5 - 5.1 mmol/L   Chloride 100 98 - 111 mmol/L   CO2 25 22 - 32 mmol/L   Glucose, Bld 105 (H) 70 - 99 mg/dL   BUN 10 6 - 20 mg/dL   Creatinine, Ser 0.82 0.61 - 1.24 mg/dL   Calcium 8.9 8.9 - 10.3 mg/dL   Total Protein 8.0 6.5 - 8.1 g/dL   Albumin 4.1 3.5 - 5.0 g/dL   AST 34 15 - 41 U/L   ALT 32 0 - 44 U/L   Alkaline Phosphatase 69 38 - 126 U/L   Total Bilirubin 0.6 0.3 - 1.2 mg/dL   GFR, Estimated >60 >60 mL/min   Anion gap 10 5 - 15  Lipase, blood     Status: None   Collection Time: 09/03/20  2:09 PM  Result Value Ref Range   Lipase 24 11 - 51 U/L  CBC with Differential     Status: Abnormal   Collection Time: 09/03/20  2:09 PM  Result Value Ref Range   WBC 8.7 4.0 - 10.5 K/uL   RBC 5.04 4.22 - 5.81 MIL/uL   Hemoglobin 16.6 13.0 - 17.0 g/dL   HCT 45.8 39.0 - 52.0 %   MCV 90.9 80.0 - 100.0 fL   MCH 32.9 26.0 - 34.0 pg   MCHC 36.2 (H) 30.0 - 36.0 g/dL   RDW 12.3 11.5 - 15.5 %   Platelets 208 150 - 400 K/uL   nRBC 0.0 0.0 - 0.2 %   Neutrophils Relative % 74 %   Neutro Abs 6.5 1.7 - 7.7 K/uL   Lymphocytes Relative 11 %   Lymphs Abs 1.0 0.7 - 4.0 K/uL   Monocytes Relative 14 %   Monocytes Absolute 1.2 (H) 0.1 - 1.0 K/uL   Eosinophils Relative 1 %   Eosinophils Absolute 0.1 0.0 - 0.5 K/uL   Basophils Relative 0 %   Basophils Absolute 0.0 0.0 - 0.1 K/uL   Immature Granulocytes 0 %   Abs Immature Granulocytes 0.03 0.00 - 0.07 K/uL  Resp Panel by RT-PCR (Flu A&B, Covid) Nasopharyngeal Swab     Status: None   Collection Time: 09/03/20  2:09 PM   Specimen: Nasopharyngeal Swab; Nasopharyngeal(NP) swabs in vial transport medium  Result Value Ref Range   SARS Coronavirus 2 by RT PCR NEGATIVE NEGATIVE   Influenza A by PCR NEGATIVE NEGATIVE   Influenza B by PCR NEGATIVE NEGATIVE     Imaging Orders         CT ABDOMEN PELVIS W CONTRAST         DG Abd Portable 1V-Small Bowel Protocol-Position Verification         DG Abd  Portable 1V-Small Bowel Obstruction Protocol-initial, 8 hr delay         DG Abd Portable 1V-Small Bowel Obstruction Protocol-24 hr delay      Assessment and Plan   Ian Gomez is an 50 y.o. male with a history of cholecystectomy and hernia repair who presents with a small bowel obstruction.  I recommended NG tube placement with small bowel protocol.  Unfortunately due to his deviated septum, an NG tube was unable to be placed.  As his nausea is controlled at this time, we will try to hold off on NG tube placement, but if he would worsens we may need to reattempt placement.  May be able to do the small bowel protocol by mouth in the morning.  Certainly if he fails to improve with these conservative measures, he may require surgery.    Felicie Morn, MD  [facility] Use [email or web address] to contact on call provider

## 2020-09-04 ENCOUNTER — Inpatient Hospital Stay (HOSPITAL_COMMUNITY): Payer: Managed Care, Other (non HMO)

## 2020-09-04 DIAGNOSIS — Z7901 Long term (current) use of anticoagulants: Secondary | ICD-10-CM

## 2020-09-04 MED ORDER — PANTOPRAZOLE SODIUM 40 MG IV SOLR
40.0000 mg | Freq: Two times a day (BID) | INTRAVENOUS | Status: DC
  Administered 2020-09-04 – 2020-09-07 (×7): 40 mg via INTRAVENOUS
  Filled 2020-09-04 (×7): qty 40

## 2020-09-04 MED ORDER — ENOXAPARIN SODIUM 40 MG/0.4ML IJ SOSY
40.0000 mg | PREFILLED_SYRINGE | INTRAMUSCULAR | Status: DC
  Administered 2020-09-04 – 2020-09-07 (×4): 40 mg via SUBCUTANEOUS
  Filled 2020-09-04 (×4): qty 0.4

## 2020-09-04 MED ORDER — DIATRIZOATE MEGLUMINE & SODIUM 66-10 % PO SOLN
90.0000 mL | Freq: Once | ORAL | Status: AC
  Administered 2020-09-04: 90 mL via ORAL
  Filled 2020-09-04: qty 90

## 2020-09-04 MED ORDER — CHLORHEXIDINE GLUCONATE CLOTH 2 % EX PADS
6.0000 | MEDICATED_PAD | Freq: Every day | CUTANEOUS | Status: DC
  Administered 2020-09-04 – 2020-09-05 (×2): 6 via TOPICAL

## 2020-09-04 MED ORDER — HYDROMORPHONE HCL 1 MG/ML IJ SOLN
0.5000 mg | INTRAMUSCULAR | Status: DC | PRN
  Administered 2020-09-04 – 2020-09-05 (×4): 1 mg via INTRAVENOUS
  Filled 2020-09-04 (×4): qty 1

## 2020-09-04 MED ORDER — DIAZEPAM 5 MG/ML IJ SOLN
2.5000 mg | Freq: Four times a day (QID) | INTRAMUSCULAR | Status: DC | PRN
  Administered 2020-09-04 – 2020-09-05 (×3): 2.5 mg via INTRAVENOUS
  Filled 2020-09-04 (×6): qty 2

## 2020-09-04 NOTE — Progress Notes (Signed)
Mobility Specialist - Progress Note    09/04/20 1422  Oxygen Therapy  SpO2 99 %  O2 Device Nasal Cannula  O2 Flow Rate (L/min) 3 L/min  Mobility  Activity Ambulated in hall  Level of Assistance Modified independent, requires aide device or extra time  Assistive Device Other (Comment) (IV Pole)  Distance Ambulated (ft) 250 ft  Mobility Ambulated with assistance in hallway  Mobility Response Tolerated well  Mobility performed by Mobility specialist;Family member  $Mobility charge 1 Mobility    Pre-mobility: 99% SpO2 During mobility: 99% SpO2 Post-mobility: 99% SpO2  When sitting EOB, pt practiced log roll technique to relieve tension from chest. Pt required no assistance from mobility specialist to sit or stand EOB, but did require extra time. Pt pushed IV pole while ambulating ~250 ft in hallway while on 3L of O2 and family by his side. No reports of dizziness, pain, or SOB were presented. Pt returned to bed after session and was left with call bell at side and family in room. RN informed of session.   Arliss Journey Mobility Specialist Acute Rehabilitation Services Phone: [phone] 09/04/20, 2:27 PM

## 2020-09-04 NOTE — Progress Notes (Signed)
    CB:[id number] pain, multiple episodes of diarrhea, vomiting  Subjective: No further nausea, except with some pain medicine no further diarrhea or BM.  No one is sick at home he and his wife have both had the same thing to eat and drink without issue for her.  Objective: Vital signs in last 24 hours: Temp:  [98.9 F (37.2 C)-99.6 F (37.6 C)] 98.9 F (37.2 C) (08/17 1635) Pulse Rate:  [64-128] 64 (08/18 0424) Resp:  [11-20] 16 (08/18 0424) BP: (117-141)/(55-98) 131/73 (08/18 0424) SpO2:  [90 %-99 %] 91 % (08/18 0424) Weight:  [96.2 kg] 96.2 kg (08/17 1316)  No intake or output Afebrile, VSS Labs OK No films - gastrografin not given last PM  Intake/Output from previous day: 08/17 0701 - 08/18 0700 In: 2000 [IV Piggyback:2000] Out: 6160 [Urine:1450] Intake/Output this shift: No intake/output data recorded.  General appearance: alert, cooperative, and no distress Resp: clear to auscultation bilaterally GI: Soft, he is not really distended.  He is not tender to palpation.  He has no bowel sounds, no flatus, or BM.  Lab Results:  Recent Labs    09/03/20 1409  WBC 8.7  HGB 16.6  HCT 45.8  PLT 208    BMET Recent Labs    09/03/20 1409  NA 135  K 3.8  CL 100  CO2 25  GLUCOSE 105*  BUN 10  CREATININE 0.82  CALCIUM 8.9   PT/INR No results for input(s): LABPROT, INR in the last 72 hours.  Recent Labs  Lab 09/03/20 1409  AST 34  ALT 32  ALKPHOS 69  BILITOT 0.6  PROT 8.0  ALBUMIN 4.1     Lipase     Component Value Date/Time   LIPASE 24 09/03/2020 1409     Medications:  diatrizoate meglumine-sodium  90 mL Per NG tube Once   enoxaparin (LOVENOX) injection  40 mg Subcutaneous Q24H   lip balm  1 application Topical BID    lactated ringers     lactated ringers 75 mL/hr at 09/04/20 0248   methocarbamol (ROBAXIN) IV     ondansetron (ZOFRAN) IV       Assessment/Plan  SBO vs ileus Hx cholecystectomy/hernia repair  -Unable to place NG secondary  to deviated septum  -We will give the Gastrografin p.o. and see how he does.  - recheck labs in AM    FEN:  NPO/IV fluids  ID:  none [id number] - on Xarelto at home  Hx DVT/PE on Xarelto at home HLA B27 Restless leg syndrome Sicca Chronic gout    LOS: 1 day    Nhi Butrum 09/04/2020 Please see Amion

## 2020-09-04 NOTE — Progress Notes (Signed)
Wasted 1.5 ml, 7.5 mg of Valium IV. Nurse [facility] observed.

## 2020-09-05 ENCOUNTER — Inpatient Hospital Stay (HOSPITAL_COMMUNITY): Payer: Managed Care, Other (non HMO)

## 2020-09-05 LAB — BASIC METABOLIC PANEL
Anion gap: 6 (ref 5–15)
BUN: 9 mg/dL (ref 6–20)
CO2: 30 mmol/L (ref 22–32)
Calcium: 8.4 mg/dL — ABNORMAL LOW (ref 8.9–10.3)
Chloride: 101 mmol/L (ref 98–111)
Creatinine, Ser: 0.78 mg/dL (ref 0.61–1.24)
GFR, Estimated: >60 mL/min (ref >60)
Glucose, Bld: 100 mg/dL — ABNORMAL HIGH (ref 70–99)
Potassium: 3.9 mmol/L (ref 3.5–5.1)
Sodium: 137 mmol/L (ref 135–145)

## 2020-09-05 LAB — CBC
HCT: 39.9 % (ref 39.0–52.0)
Hemoglobin: 13.6 g/dL (ref 13.0–17.0)
MCH: 32.8 pg (ref 26.0–34.0)
MCHC: 34.1 g/dL (ref 30.0–36.0)
MCV: 96.1 fL (ref 80.0–100.0)
Platelets: 164 10*3/uL (ref 150–400)
RBC: 4.15 MIL/uL — ABNORMAL LOW (ref 4.22–5.81)
RDW: 12.2 % (ref 11.5–15.5)
WBC: 7.1 10*3/uL (ref 4.0–10.5)
nRBC: 0 % (ref 0.0–0.2)

## 2020-09-05 LAB — MAGNESIUM: Magnesium: 2 mg/dL (ref 1.7–2.4)

## 2020-09-05 MED ORDER — ACETAMINOPHEN 325 MG PO TABS
325.0000 mg | ORAL_TABLET | Freq: Four times a day (QID) | ORAL | Status: DC | PRN
  Filled 2020-09-05: qty 2

## 2020-09-05 MED ORDER — TAMSULOSIN HCL 0.4 MG PO CAPS
0.4000 mg | ORAL_CAPSULE | Freq: Every day | ORAL | Status: DC
  Administered 2020-09-05 – 2020-09-07 (×3): 0.4 mg via ORAL
  Filled 2020-09-05 (×3): qty 1

## 2020-09-05 MED ORDER — ALLOPURINOL 300 MG PO TABS
300.0000 mg | ORAL_TABLET | Freq: Every day | ORAL | Status: DC
  Administered 2020-09-05 – 2020-09-07 (×3): 300 mg via ORAL
  Filled 2020-09-05 (×3): qty 1

## 2020-09-05 MED ORDER — BUTALBITAL-APAP-CAFFEINE 50-325-40 MG PO CAPS: 1.0000 | ORAL_CAPSULE | Freq: Three times a day (TID) | ORAL | Status: DC | PRN

## 2020-09-05 MED ORDER — DIAZEPAM 5 MG PO TABS: 2.5000 mg | ORAL_TABLET | Freq: Every day | ORAL | Status: DC

## 2020-09-05 MED ORDER — ASPIRIN-ACETAMINOPHEN-CAFFEINE 250-250-65 MG PO TABS
1.0000 | ORAL_TABLET | Freq: Four times a day (QID) | ORAL | Status: DC | PRN
  Administered 2020-09-05 – 2020-09-07 (×3): 1 via ORAL
  Filled 2020-09-05 (×5): qty 1

## 2020-09-05 MED ORDER — ACETAMINOPHEN 500 MG PO TABS
500.0000 mg | ORAL_TABLET | Freq: Three times a day (TID) | ORAL | Status: DC
  Administered 2020-09-05 – 2020-09-07 (×7): 500 mg via ORAL
  Administered 2020-09-07: 1000 mg via ORAL
  Filled 2020-09-05 (×8): qty 1

## 2020-09-05 MED ORDER — IBUPROFEN 400 MG PO TABS
600.0000 mg | ORAL_TABLET | Freq: Four times a day (QID) | ORAL | Status: DC | PRN
  Administered 2020-09-05 – 2020-09-06 (×2): 600 mg via ORAL
  Filled 2020-09-05 (×2): qty 1

## 2020-09-05 MED ORDER — ACETAMINOPHEN 650 MG RE SUPP: 650.0000 mg | Freq: Four times a day (QID) | RECTAL | Status: DC | PRN

## 2020-09-05 MED ORDER — CALCIUM POLYCARBOPHIL 625 MG PO TABS
625.0000 mg | ORAL_TABLET | Freq: Two times a day (BID) | ORAL | Status: DC
  Administered 2020-09-06 – 2020-09-07 (×4): 625 mg via ORAL
  Filled 2020-09-05 (×5): qty 1

## 2020-09-05 MED ORDER — DULOXETINE HCL 20 MG PO CPEP
40.0000 mg | ORAL_CAPSULE | Freq: Every day | ORAL | Status: DC
  Administered 2020-09-05 – 2020-09-07 (×3): 40 mg via ORAL
  Filled 2020-09-05 (×4): qty 2

## 2020-09-05 MED ORDER — DIAZEPAM 5 MG PO TABS
2.5000 mg | ORAL_TABLET | Freq: Four times a day (QID) | ORAL | Status: DC | PRN
  Administered 2020-09-06 – 2020-09-07 (×2): 2.5 mg via ORAL
  Filled 2020-09-05 (×3): qty 1

## 2020-09-05 NOTE — Progress Notes (Signed)
   09/05/20 1455  Mobility  Activity Refused mobility   Pt refused mobility today secondary to walking with his wife prior to MS arrival. She stated they walked up and down the hall (about 368ft). Encouraged him to walk again later tonight if wife is present.    Timoteo Expose Mobility Specialist Acute Rehab Services Office: [phone]

## 2020-09-05 NOTE — Plan of Care (Signed)
  Problem: Education: Goal: Knowledge of General Education information will improve Description: Including pain rating scale, medication(s)/side effects and non-pharmacologic comfort measures Outcome: Progressing   Problem: Elimination: Goal: Will not experience complications related to urinary retention Outcome: Progressing   

## 2020-09-05 NOTE — Progress Notes (Signed)
Progress Note     Subjective: CC: nausea, headache Persistent nausea and headache. He has migraines at baseline with several medications he takes for them. He has not had emesis and has had 2 bowel movements. Abdominal pain is stable to improved. He states he has had epigastric fullness after eating for many years and has had endoscopy in the past (approx 5 years ago). His baseline GERD has only been well managed by nexium. Foley was placed due to difficulty urinating while in the ED. Not ambulating much due to headache  Wife is bedside  Objective: Vital signs in last 24 hours: Temp:  [97.7 F (36.5 C)-98.9 F (37.2 C)] 98.5 F (36.9 C) (08/19 0942) Pulse Rate:  [61-73] 72 (08/19 0942) Resp:  [17-19] 18 (08/19 0942) BP: (112-126)/(53-71) 124/58 (08/19 0942) SpO2:  [91 %-100 %] 92 % (08/19 0942) Last BM Date:  (PTA)  Intake/Output from previous day: 08/18 0701 - 08/19 0700 In: 1977.5 [I.V.:1877.5; IV Piggyback:100] Out: 2700 [Urine:2700] Intake/Output this shift: Total I/O In: -  Out: 200 [Urine:200]  PE: General: pleasant, WD, male who is laying in bed in NAD HEENT: head is normocephalic, atraumatic.   Mouth is pink and moist Heart: regular, rate, and rhythm.  Palpable radial and pedal pulses bilaterally Lungs:  Respiratory effort nonlabored Abd: soft, +BS, mild distension. Mild TTP over RLQ MSK: all 4 extremities are symmetrical with no cyanosis, clubbing, or edema. No calf TTP bilaterally. SCDs in place Skin: warm and dry with no masses, lesions, or rashes Psych: A&Ox3 with an appropriate affect.    Lab Results:  Recent Labs    09/03/20 1409 09/05/20 0433  WBC 8.7 7.1  HGB 16.6 13.6  HCT 45.8 39.9  PLT 208 164   BMET Recent Labs    09/03/20 1409 09/05/20 0433  NA 135 137  K 3.8 3.9  CL 100 101  CO2 25 30  GLUCOSE 105* 100*  BUN 10 9  CREATININE 0.82 0.78  CALCIUM 8.9 8.4*   PT/INR No results for input(s): LABPROT, INR in the last 72  hours. CMP     Component Value Date/Time   NA 137 09/05/2020 0433   K 3.9 09/05/2020 0433   CL 101 09/05/2020 0433   CO2 30 09/05/2020 0433   GLUCOSE 100 (H) 09/05/2020 0433   BUN 9 09/05/2020 0433   CREATININE 0.78 09/05/2020 0433   CALCIUM 8.4 (L) 09/05/2020 0433   PROT 8.0 09/03/2020 1409   ALBUMIN 4.1 09/03/2020 1409   AST 34 09/03/2020 1409   ALT 32 09/03/2020 1409   ALKPHOS 69 09/03/2020 1409   BILITOT 0.6 09/03/2020 1409   GFRNONAA >60 09/05/2020 0433   Lipase     Component Value Date/Time   LIPASE 24 09/03/2020 1409       Studies/Results: CT ABDOMEN PELVIS W CONTRAST  Result Date: 09/03/2020 CLINICAL DATA:  Nausea and vomiting. Left upper quadrant abdominal pain. EXAM: CT ABDOMEN AND PELVIS WITH CONTRAST TECHNIQUE: Multidetector CT imaging of the abdomen and pelvis was performed using the standard protocol following bolus administration of intravenous contrast. CONTRAST:  29mL OMNIPAQUE IOHEXOL 300 MG/ML  SOLN COMPARISON:  CT abdomen pelvis 01/31/2012 FINDINGS: Lower chest: Bilateral lower lobe subsegmental atelectasis. Hepatobiliary: No focal liver abnormality. Status post cholecystectomy. No biliary dilatation. Pancreas: Diffusely atrophic. No focal lesion. Otherwise normal pancreatic contour. No surrounding inflammatory changes. No main pancreatic ductal dilatation. Spleen: Normal in size without focal abnormality. Adrenals/Urinary Tract: No adrenal nodule bilaterally. Bilateral kidneys enhance symmetrically. No  hydronephrosis. No hydroureter. The urinary bladder is unremarkable. Stomach/Bowel: Stomach is within normal limits. Several loops of mid abdomen small bowel demonstrate dilatation with gas and air-fluid levels. Small bowel caliber measures up to 3.8 cm on axial images (2:48). No definite transition point. No pneumatosis. No surrounding fat stranding. No evidence of large bowel wall thickening or dilatation. Scattered colonic diverticulosis. Appendix appears normal  in caliber with no associated inflammatory changes. Appendicoliths are noted within its lumen. Vascular/Lymphatic: No abdominal aorta or iliac aneurysm. No abdominal, pelvic, or inguinal lymphadenopathy. Reproductive: Prostate is unremarkable. Other: No intraperitoneal free fluid. No intraperitoneal free gas. No organized fluid collection. Musculoskeletal: No abdominal wall hernia or abnormality. No suspicious lytic or blastic osseous lesions. No acute displaced fracture. Multilevel degenerative changes of the spine. IMPRESSION: 1. Findings suggestive of an early/partial bowel obstruction versus ileus. No transition point suggest complete bowel obstruction. No findings to suggest bowel ischemia or perforation. 2. Scattered colonic diverticulosis with no acute diverticulitis. Electronically Signed   By: Iven Finn M.D.   On: 09/03/2020 15:13   DG Abd Portable 1V-Small Bowel Obstruction Protocol-initial, 8 hr delay  Result Date: 09/04/2020 CLINICAL DATA:  Small-bowel obstruction EXAM: PORTABLE ABDOMEN - 1 VIEW COMPARISON:  CT 09/03/2020 FINDINGS: There are multiple dilated loops of small bowel which are filled with oral contrast. There is some increased density within the hepatic flexure of the colon which is likely contrast. IMPRESSION: Some increased density within the hepatic flexure of the colon, likely contrast material, though there are persistent multiple dilated loops of small bowel filled with oral contrast. Findings likely represent at least partial small bowel obstruction. An additional follow-up radiograph in 4 hours would be useful to monitor progression. Electronically Signed   By: Maurine Simmering M.D.   On: 09/04/2020 16:48   DG Abd Portable 2V  Result Date: 09/05/2020 CLINICAL DATA:  Small bowel obstruction EXAM: PORTABLE ABDOMEN - 2 VIEW COMPARISON:  09/04/2020 FINDINGS: Oral contrast throughout the colon extending to the rectum. No bowel dilatation to suggest obstruction. No evidence of  pneumoperitoneum, portal venous gas or pneumatosis. No pathologic calcifications along the expected course of the ureters. No acute osseous abnormality. IMPRESSION: No evidence of bowel obstruction. Electronically Signed   By: Kathreen Devoid M.D.   On: 09/05/2020 08:05    Anti-infectives: Anti-infectives (From admission, onward)    None        Assessment/Plan SBO vs ileus Hx cholecystectomy/hernia repair -Unable to place NG secondary to deviated septum - am abd xray with contrast through colon to rectum and no evidence of SBO - trial clear liquid diet. 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

## 2020-09-06 ENCOUNTER — Inpatient Hospital Stay (HOSPITAL_COMMUNITY): Payer: Managed Care, Other (non HMO)

## 2020-09-06 NOTE — Progress Notes (Signed)
   Subjective/Chief Complaint: Complains of upper abd pain. No vomiting. Having loose bm's   Objective: Vital signs in last 24 hours: Temp:  [98.4 F (36.9 C)] 98.4 F (36.9 C) (08/20 0616) Pulse Rate:  [58-72] 58 (08/20 0616) Resp:  [16-18] 16 (08/20 0616) BP: (122-131)/(64-79) 122/66 (08/20 0616) SpO2:  [92 %-100 %] 100 % (08/20 0616) Last BM Date: 09/05/20  Intake/Output from previous day: 08/19 0701 - 08/20 0700 In: 320 [P.O.:120; I.V.:200] Out: 1000 [Urine:1000] Intake/Output this shift: Total I/O In: 240 [P.O.:240] Out: -   General appearance: alert and cooperative Resp: clear to auscultation bilaterally Cardio: regular rate and rhythm GI: soft, minimal tenderness. Minimal distension. Good bs  Lab Results:  Recent Labs    09/03/20 1409 09/05/20 0433  WBC 8.7 7.1  HGB 16.6 13.6  HCT 45.8 39.9  PLT 208 164   BMET Recent Labs    09/03/20 1409 09/05/20 0433  NA 135 137  K 3.8 3.9  CL 100 101  CO2 25 30  GLUCOSE 105* 100*  BUN 10 9  CREATININE 0.82 0.78  CALCIUM 8.9 8.4*   PT/INR No results for input(s): LABPROT, INR in the last 72 hours. ABG No results for input(s): PHART, HCO3 in the last 72 hours.  Invalid input(s): PCO2, PO2  Studies/Results: DG Abd Portable 1V-Small Bowel Obstruction Protocol-initial, 8 hr delay  Result Date: 09/04/2020 CLINICAL DATA:  Small-bowel obstruction EXAM: PORTABLE ABDOMEN - 1 VIEW COMPARISON:  CT 09/03/2020 FINDINGS: There are multiple dilated loops of small bowel which are filled with oral contrast. There is some increased density within the hepatic flexure of the colon which is likely contrast. IMPRESSION: Some increased density within the hepatic flexure of the colon, likely contrast material, though there are persistent multiple dilated loops of small bowel filled with oral contrast. Findings likely represent at least partial small bowel obstruction. An additional follow-up radiograph in 4 hours would be useful to  monitor progression. Electronically Signed   By: Maurine Simmering M.D.   On: 09/04/2020 16:48   DG Abd Portable 2V  Result Date: 09/05/2020 CLINICAL DATA:  Small bowel obstruction EXAM: PORTABLE ABDOMEN - 2 VIEW COMPARISON:  09/04/2020 FINDINGS: Oral contrast throughout the colon extending to the rectum. No bowel dilatation to suggest obstruction. No evidence of pneumoperitoneum, portal venous gas or pneumatosis. No pathologic calcifications along the expected course of the ureters. No acute osseous abnormality. IMPRESSION: No evidence of bowel obstruction. Electronically Signed   By: Kathreen Devoid M.D.   On: 09/05/2020 08:05    Anti-infectives: Anti-infectives (From admission, onward)    None       Assessment/Plan: s/p * No surgery found * Stay on fulls today since he had some pain this am Recheck abd xrays Sbo seems to be resolving but will go slow and monitor closely SBO vs ileus Hx cholecystectomy/hernia repair -Unable to place NG secondary to deviated septum - am abd xray with contrast through colon to rectum and no evidence of SBO - trial clear liquid diet. 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

## 2020-09-07 MED ORDER — PANTOPRAZOLE SODIUM 40 MG PO TBEC: 40.0000 mg | DELAYED_RELEASE_TABLET | Freq: Every day | ORAL | Status: DC

## 2020-09-07 NOTE — Progress Notes (Signed)
   Subjective/Chief Complaint: Complains only of the watery diarrhea. No belly pain or nausea   Objective: Vital signs in last 24 hours: Temp:  [97.6 F (36.4 C)-98.4 F (36.9 C)] 98.4 F (36.9 C) (08/21 0546) Pulse Rate:  [56-66] 61 (08/21 0546) Resp:  [17-18] 18 (08/21 0546) BP: (124-131)/(69-74) 127/74 (08/21 0546) SpO2:  [99 %-100 %] 99 % (08/21 0546) Last BM Date: 09/06/20  Intake/Output from previous day: 08/20 0701 - 08/21 0700 In: 1398.9 [P.O.:600; I.V.:798.9] Out: -  Intake/Output this shift: No intake/output data recorded.  General appearance: alert and cooperative Resp: clear to auscultation bilaterally Cardio: regular rate and rhythm GI: soft, nontender. Not distended  Lab Results:  Recent Labs    09/05/20 0433  WBC 7.1  HGB 13.6  HCT 39.9  PLT 164   BMET Recent Labs    09/05/20 0433  NA 137  K 3.9  CL 101  CO2 30  GLUCOSE 100*  BUN 9  CREATININE 0.78  CALCIUM 8.4*   PT/INR No results for input(s): LABPROT, INR in the last 72 hours. ABG No results for input(s): PHART, HCO3 in the last 72 hours.  Invalid input(s): PCO2, PO2  Studies/Results: DG Abd 2 Views  Result Date: 09/06/2020 CLINICAL DATA:  Follow-up for small bowel obstruction. EXAM: ABDOMEN - 2 VIEW COMPARISON:  09/05/2020 and older exams.  CT, 09/03/2020. FINDINGS: Small amount of residual contrast noted in the colon and in a nondilated appendix. There is no bowel dilation to suggest bowel obstruction. No air-fluid levels and no free air. IMPRESSION: 1. No evidence of bowel obstruction or significant adynamic ileus. No free air. Most of the contrast has passed since the previous exam. Electronically Signed   By: Lajean Manes M.D.   On: 09/06/2020 12:15    Anti-infectives: Anti-infectives (From admission, onward)    None       Assessment/Plan: s/p * No surgery found * Advance diet to regular Sbo seems to be resolving but will go slow and monitor closely SBO vs ileus Hx  cholecystectomy/hernia repair -Unable to place NG secondary to deviated septum - am abd xray with contrast through colon to rectum and no evidence of SBO - trial clear liquid diet. 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

## 2020-09-08 MED ORDER — CALCIUM POLYCARBOPHIL 625 MG PO TABS: 625.0000 mg | ORAL_TABLET | Freq: Every day | ORAL | Status: AC

## 2020-09-08 NOTE — Progress Notes (Signed)
Pt given discharge packet. Education provided. All questions answered. Family not at bedside. IV removed. Pt states his ride will be here around 1000.

## 2020-09-08 NOTE — Plan of Care (Signed)

## 2020-09-08 NOTE — Progress Notes (Signed)
   Subjective/Chief Complaint: Tolerating diet without nausea or bloating. No bm yet today, endorses flatus   Objective: Vital signs in last 24 hours: Temp:  [98 F (36.7 C)-98.2 F (36.8 C)] 98 F (36.7 C) (08/22 0612) Pulse Rate:  [57-80] 57 (08/22 0612) Resp:  [16] 16 (08/22 0612) BP: (126-139)/(66-78) 126/76 (08/22 0612) SpO2:  [97 %-100 %] 97 % (08/22 0612) Last BM Date: 09/06/20  Intake/Output from previous day: 08/21 0701 - 08/22 0700 In: 2064.3 [P.O.:1680; I.V.:384.3] Out: 0  Intake/Output this shift: No intake/output data recorded.  General appearance: alert and cooperative Resp: clear to auscultation bilaterally Cardio: regular rate and rhythm GI: soft, nontender. Not distended  Lab Results:  No results for input(s): WBC, HGB, HCT, PLT in the last 72 hours.  BMET No results for input(s): NA, K, CL, CO2, GLUCOSE, BUN, CREATININE, CALCIUM in the last 72 hours.  PT/INR No results for input(s): LABPROT, INR in the last 72 hours. ABG No results for input(s): PHART, HCO3 in the last 72 hours.  Invalid input(s): PCO2, PO2  Studies/Results: DG Abd 2 Views  Result Date: 09/06/2020 CLINICAL DATA:  Follow-up for small bowel obstruction. EXAM: ABDOMEN - 2 VIEW COMPARISON:  09/05/2020 and older exams.  CT, 09/03/2020. FINDINGS: Small amount of residual contrast noted in the colon and in a nondilated appendix. There is no bowel dilation to suggest bowel obstruction. No air-fluid levels and no free air. IMPRESSION: 1. No evidence of bowel obstruction or significant adynamic ileus. No free air. Most of the contrast has passed since the previous exam. Electronically Signed   By: Lajean Manes M.D.   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

## 2020-09-08 NOTE — Discharge Summary (Signed)
Physician Discharge Summary  Patient ID: Ian Gomez MRN: [id number] DOB/AGE: September 08, 1970 50 y.o.  Admit date: 09/03/2020 Discharge date: 09/08/2020  Admission Diagnoses:  Discharge Diagnoses:  Principal Problem:   SBO (small bowel obstruction) ([place]) Active Problems:   History of pulmonary embolism   History of DVT (deep vein thrombosis)   HLA B27 (HLA B27 positive)   RLS (restless legs syndrome)   Chronic gout of multiple sites   History of COVID-19 [id number]   Small bowel obstruction ([facility])   Chronic anticoagulation   Discharged Condition: good  Hospital Course: Patient was admitted with partial small bowel obstruction versus enteritis with ileus.  His symptoms resolved quickly with supportive care and his diet was able to be advanced.  His diarrhea improved with the addition of FiberCon.  He was deemed stable for discharge on 09/08/2020.  Significant Diagnostic Studies: Imaging studies, see epic record  Treatments: Supportive care  Discharge Exam: Blood pressure 126/76, pulse (!) 57, temperature 98 F (36.7 C), temperature source Oral, resp. rate 16, height _0  (1.778 m), weight 96.2 kg, SpO2 97 %. See rounding note  Disposition: Discharge disposition: 01-Home or Self Care        Allergies as of 09/08/2020   No Known Allergies      Medication List     TAKE these medications    allopurinol 300 MG tablet Commonly known as: ZYLOPRIM Take 300 mg by mouth daily.   aspirin-acetaminophen-caffeine 250-250-65 MG tablet Commonly known as: EXCEDRIN MIGRAINE Take 1 tablet by mouth every 6 (six) hours as needed for headache.   Butalbital-APAP-Caffeine 50-325-40 MG capsule Take 1 capsule by mouth 3 (three) times daily as needed for headache.   diazepam 5 MG tablet Commonly known as: VALIUM Take 2.5 mg by mouth daily.   DULoxetine HCl 40 MG Cpep Take 1 capsule by mouth daily.   esomeprazole 40 MG capsule Commonly known as: NEXIUM Take 40 mg by mouth daily at  12 noon.   polycarbophil 625 MG tablet Commonly known as: FIBERCON Take 1 tablet (625 mg total) by mouth daily. Be sure you are drinking plenty of water throughout the day.   pseudoephedrine 30 MG tablet Commonly known as: SUDAFED Take 15 mg by mouth every 4 (four) hours as needed for congestion.   Xarelto 20 MG Tabs tablet Generic drug: rivaroxaban TAKE ONE TABLET BY MOUTH DAILY WITH EVENING MEAL What changed: Another medication with the same name was removed. Continue taking this medication, and follow the directions you see here.        Follow-up Information     Charleston Poot, MD Follow up in 1 week(s).   Specialty: Internal Medicine Why: Follow up with your primary care provider to review any needed medication changes and to update them on recent hospitalization Contact information: [address] [place] [address] [phone]                 Signed: Clovis Riley 09/08/2020, 8:40 AM

## 2022-02-23 IMAGING — DX DG ABD PORTABLE 1V
1 series · 1 of 1 positions shown · non-contrast
Comparison: CT 09/03/2020

CLINICAL DATA: Small-bowel obstruction

EXAM:
PORTABLE ABDOMEN - 1 VIEW

[abdomen kub]
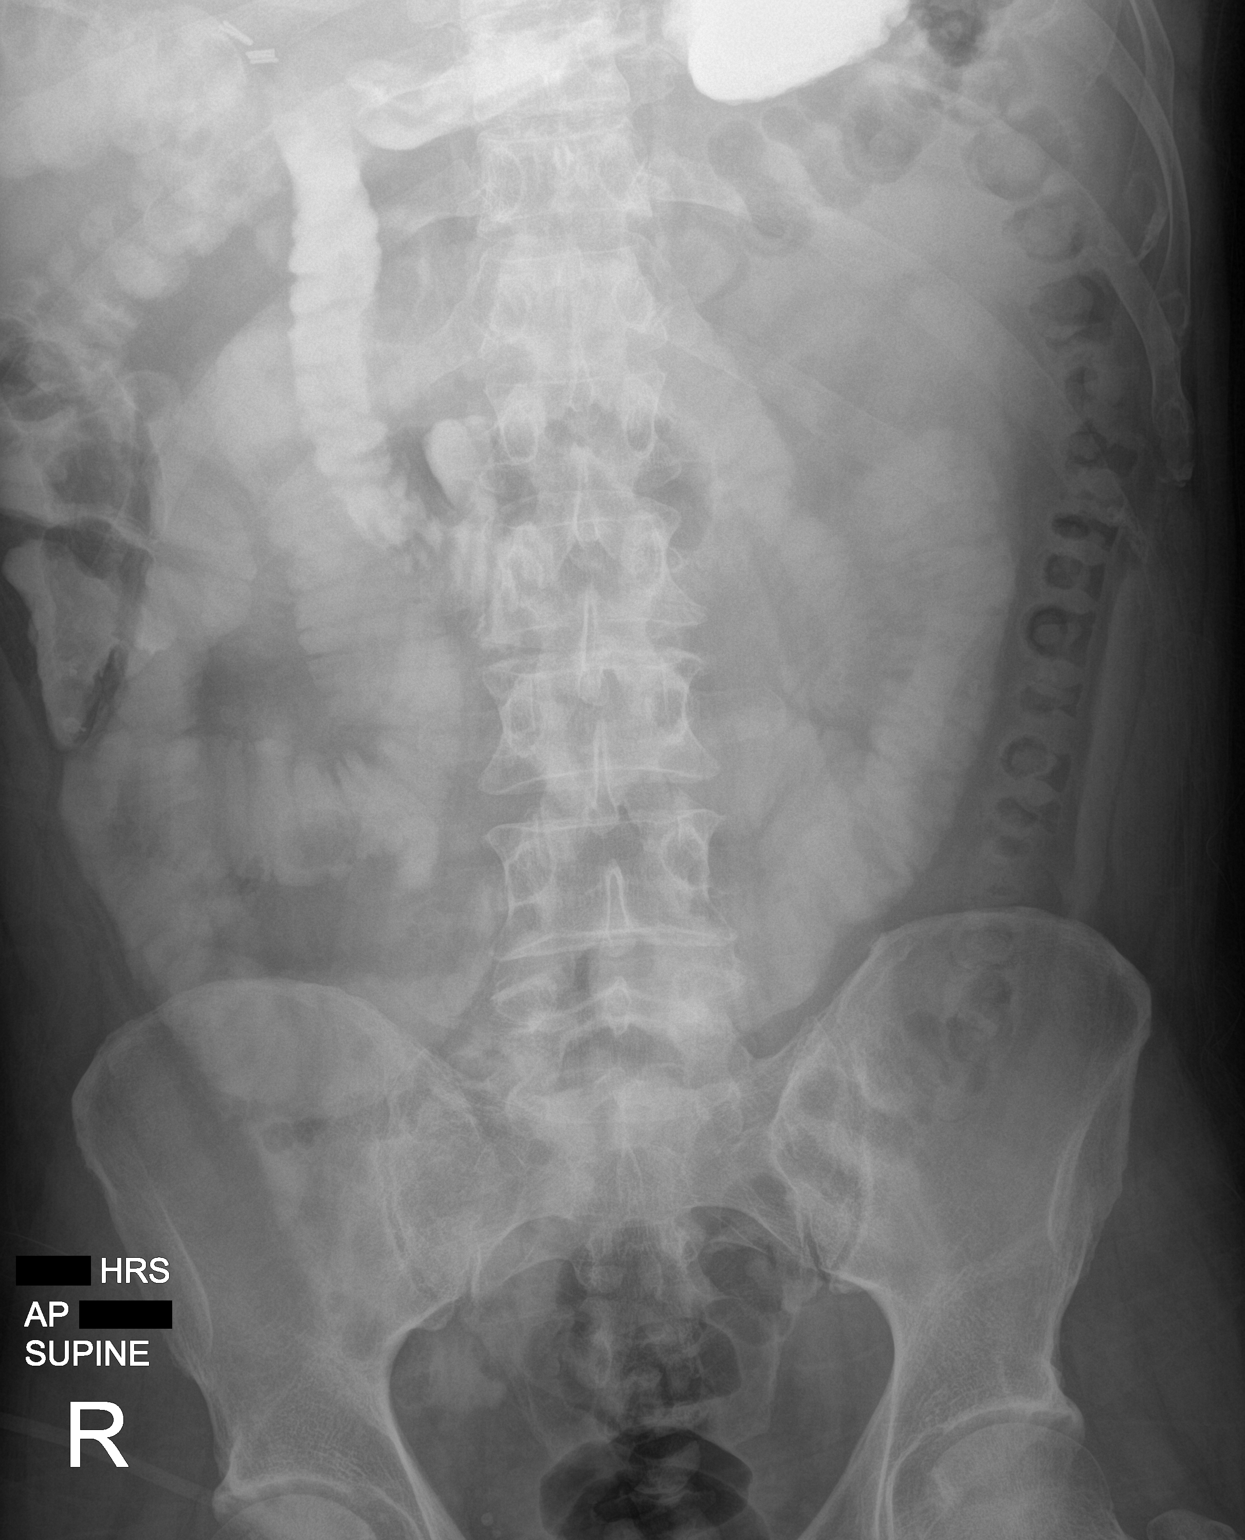

[1 of 1 positions shown; findings below may reference images not displayed]

FINDINGS: There are multiple dilated loops of small bowel which are filled
with oral contrast. There is some increased density within the
hepatic flexure of the colon which is likely contrast.
IMPRESSION: Some increased density within the hepatic flexure of the colon,
likely contrast material, though there are persistent multiple
dilated loops of small bowel filled with oral contrast. Findings
likely represent at least partial small bowel obstruction. An
additional follow-up radiograph in 4 hours would be useful to
monitor progression.
